# Patient Record
Sex: Female | Born: 1970 | Race: White | Hispanic: No
Health system: Southern US, Community
[De-identification: ages and names within clinical notes are randomized; demographics above are authoritative.]

## PROBLEM LIST (undated history)

## (undated) DIAGNOSIS — J45909 Unspecified asthma, uncomplicated: Secondary | ICD-10-CM

## (undated) DIAGNOSIS — J40 Bronchitis, not specified as acute or chronic: Secondary | ICD-10-CM

## (undated) HISTORY — DX: Unspecified asthma, uncomplicated: J45.909

## (undated) HISTORY — DX: Bronchitis, not specified as acute or chronic: J40

## (undated) HISTORY — PX: ECTOPIC PREGNANCY SURGERY: SHX613

## (undated) HISTORY — PX: CHOLECYSTECTOMY: SHX55

## (undated) HISTORY — PX: OVARIAN CYST REMOVAL: SHX89

---

## 2001-09-20 ENCOUNTER — Emergency Department (HOSPITAL_COMMUNITY): Admission: EM | Admit: 2001-09-20 | Discharge: 2001-09-20 | Payer: Self-pay | Admitting: Emergency Medicine

## 2009-04-15 ENCOUNTER — Emergency Department: Payer: Self-pay | Admitting: Emergency Medicine

## 2010-11-19 ENCOUNTER — Emergency Department: Payer: Self-pay | Admitting: Emergency Medicine

## 2011-10-28 ENCOUNTER — Ambulatory Visit: Payer: Self-pay | Admitting: Surgery

## 2011-10-28 LAB — PREGNANCY, URINE: Pregnancy Test, Urine: NEGATIVE m[IU]/mL

## 2014-07-26 NOTE — Op Note (Signed)
PATIENT NAME:  Kaylee Burke, Kaylee Burke MR#:  [id number] DATE OF BIRTH:  02-Feb-1971  DATE OF PROCEDURE:  10/28/2011  PREOPERATIVE DIAGNOSIS: Chronic cholecystitis, cholelithiasis.   POSTOPERATIVE DIAGNOSIS: Chronic cholecystitis, cholelithiasis.   PROCEDURE: Laparoscopic cholecystectomy.   SURGEON: Adella HareJ. Wilton Smith, MD   ANESTHESIA: General.   INDICATIONS: This 44 year old female has a history of right upper quadrant abdominal pain with ultrasound findings of gallstones. Surgery was recommended for definitive treatment.   DESCRIPTION OF PROCEDURE: The patient was placed on the operating table in the supine position under general endotracheal anesthesia. The abdomen was prepared with ChloraPrep and draped in a sterile manner.   A short incision was made in the inferior aspect of the umbilicus and carried down to the deep fascia which was grasped with laryngeal hook and elevated. A Veress needle was inserted, aspirated, and irrigated with a saline solution. Next, the peritoneal cavity was inflated with carbon dioxide. The Veress needle was removed. The 10 mm cannula was inserted. The 10 mm 0 degree laparoscope was inserted to view the peritoneal cavity. The liver appeared normal. The stomach appeared normal. Omentum was observed. Next, another incision was made in the epigastrium slightly to the right of the midline to introduce an 11 mm cannula. Two incisions were made in the lateral aspect of the right upper quadrant to introduce two 5-mm cannulas.   With the patient in the reverse Trendelenburg position, turned some 5 degrees to the left, the gallbladder was retracted towards the right shoulder. It did appear to have a slightly thickened wall. The pouch of Randa SpikeMorison was then retracted inferiorly and laterally. The porta hepatis was demonstrated. The neck of the gallbladder was mobilized with incision of the visceral peritoneum. The cystic duct was dissected free from surrounding structures. The cystic  artery was dissected free from surrounding structures. A critical view of safety was demonstrated. An Endoclip was placed across the cystic duct adjacent to the neck of the gallbladder. An incision was made in the cystic duct to introduce a Reddick catheter. The catheter would only thread in about 6 mm and would not stay in the duct when the balloon was inflated. It appeared that the cystic duct was small in size and, therefore, cholangiogram was not done. The Reddick catheter was removed. The cystic duct was doubly ligated with endoclips and divided. The cystic artery was controlled with an Endoclip and divided. Another branch of the cystic artery was controlled with Endoclip and divided. The gallbladder was dissected free from the liver with hook and cautery. The gallbladder was completely separated. Hemostasis was intact. The gallbladder was delivered up through the infraumbilical incision, opened, and suctioned. Multiple stones were removed with the stone forceps and the stone scoop. The gallbladder was then removed and submitted in formalin with stones for routine pathology. The right upper quadrant was further inspected, irrigated, and aspirated. Hemostasis was intact. The cannulas were removed allowing carbon dioxide to escape from the peritoneal cavity. Skin incisions were closed with interrupted 5-0 chromic subcuticular sutures, benzoin, and Steri-Strips. Dressings were applied with paper tape. The patient tolerated surgery satisfactorily and was then prepared for transfer to the recovery room for postoperative care.   ____________________________ J. Renda RollsWilton Smith, MD jws:drc D: 10/28/2011 13:24:00 ET T: 10/28/2011 13:43:39 ET JOB#: [id number]  cc: Adella HareJ. Wilton Smith, MD, <Dictator> Adella HareWILTON J SMITH MD ELECTRONICALLY SIGNED 10/28/2011 19:45

## 2015-09-26 ENCOUNTER — Other Ambulatory Visit: Payer: Self-pay | Admitting: Gastroenterology

## 2015-09-26 DIAGNOSIS — R1310 Dysphagia, unspecified: Secondary | ICD-10-CM

## 2015-09-28 ENCOUNTER — Ambulatory Visit: Payer: Self-pay

## 2015-10-04 ENCOUNTER — Ambulatory Visit
Admission: RE | Admit: 2015-10-04 | Discharge: 2015-10-04 | Disposition: A | Discharge status: Home / Self Care | Payer: BC Managed Care – PPO | Source: Ambulatory Visit | Attending: Gastroenterology | Admitting: Gastroenterology

## 2015-10-04 DIAGNOSIS — R1013 Epigastric pain: Secondary | ICD-10-CM | POA: Insufficient documentation

## 2015-10-04 DIAGNOSIS — K219 Gastro-esophageal reflux disease without esophagitis: Secondary | ICD-10-CM | POA: Diagnosis not present

## 2015-10-04 DIAGNOSIS — R1310 Dysphagia, unspecified: Secondary | ICD-10-CM

## 2015-10-25 ENCOUNTER — Encounter: Payer: BC Managed Care – PPO | Attending: Family Medicine | Admitting: Dietician

## 2015-10-25 ENCOUNTER — Encounter: Payer: Self-pay | Admitting: Dietician

## 2015-10-25 VITALS — Ht 62.0 in | Wt 171.4 lb

## 2015-10-25 DIAGNOSIS — E669 Obesity, unspecified: Secondary | ICD-10-CM

## 2015-10-25 DIAGNOSIS — E785 Hyperlipidemia, unspecified: Secondary | ICD-10-CM | POA: Diagnosis not present

## 2015-10-25 DIAGNOSIS — E119 Type 2 diabetes mellitus without complications: Secondary | ICD-10-CM

## 2015-10-25 NOTE — Patient Instructions (Signed)
   Increase calorie intake earlier in the day.   Limit late-night snacks to 1 small snack, maybe 15-25 grams carb and 7-15grams protein.   Continue making healthy food choices, great job!  Plan ahead to allow for balanced meals, and make sure to include a protein source with each meal. Protein goal is 65grams daily.

## 2015-10-25 NOTE — Progress Notes (Signed)
Medical Nutrition Therapy: Visit start time: 1630  end time: 1730  Assessment:  Diagnosis: overweight Past medical history: hyperlipidemia since her 20's, Diabetes, GERD Psychosocial issues/ stress concerns: patient reports high stress level Preferred learning method:  . Auditory . Visual . Hands-on  Current weight: 171.4lbs  Height: 5'2" Medications, supplements: reviewed list in chart with patient  Progress and evaluation: Patient reports about 30lb weight gain in past several years.          Tried Nutrisystem for a short time, but foods provided didn't suit her tastes and lifestyle.          Has followed vegetarian diet (lacto-ovo) but has added some meats recently -- was weak, tired without meats.           She has had high cholesterol levels for over 20 years, strong family history of high cholesterol. She is not taking cholesterol-lowering meds due to side effects.   Physical activity: walking, swimming weights 30 minutes, 3 times a week; has been sporadic   Dietary Intake:  Usual eating pattern includes 2-3 meals and 2 snacks per day. Dining out frequency: 5 meals per week.  Breakfast: maybe yogurt at 8-8:30 when arriving at work Snack: thin bites nuts, or mixed nuts, string cheese Lunch: 12:30 butternut squash ravioli, sandwich with tofu, sometimes just vegetables, chicken and green beans once a week.  Snack: none Supper: sometimes light meal like cheese and fruit, pizza once a week, tofu and vegetables. Does eat beans often.  Snack: cheese and fruit, veggie chips Beverages: water (trying to increase, sometimes difficult due to work), sugar free beverages: diet mt dew, crystal light  Nutrition Care Education: Topics covered: weight management, Diabetes, Hyperlipidemia Basic nutrition: basic food groups, appropriate nutrient balance, appropriate meal and snack schedule, general nutrition guidelines    Weight control: benefits of weight control, behavioral changes for weight  loss: guidance for 1300kcal daily intake -- with gradual "refeeding" to improve nutrient intake. Benefits of tracking food intake. Diabetes: appropriate meal and snack schedule, appropriate carb intake and balance Hyperlipidemia: healthy and unhealthy fats, role of fiber; food sources of folate, phytochemicals Other:  benefits of regular exercise; daily protein needs and importance of adequate B12, zinc, iron.   Nutritional Diagnosis:  [place]-3.3 Overweight/obesity As related to history of inactivity, excess calories.  As evidenced by patient report.  Intervention: Instruction as noted above.   Patient seems to be under-eating; was averaging 800kcal when tracking intake; advised adding about 100kcal every few weeks to prevent weight gain.    Set goals with patient input.   Education Materials given:  . General diet guidelines for Cholesterol-lowering/ Heart health . Food lists/ Planning A Balanced Meal . Sample meal pattern/ menus: Quick and Healthy Meal Ideas . Goals/ instructions   Learner/ who was taught:  . Patient   Level of understanding: Marland Kitchen. Verbalizes/ demonstrates competency  Demonstrated degree of understanding via:   Teach back Learning barriers: . None  Willingness to learn/ readiness for change: . Eager, change in progress  Monitoring and Evaluation:  Dietary intake, exercise, blood sugar and blood lipid levels, and body weight      follow up: 12/06/15

## 2015-12-06 ENCOUNTER — Ambulatory Visit: Payer: BC Managed Care – PPO | Admitting: Dietician

## 2015-12-25 ENCOUNTER — Encounter: Admission: RE | Payer: Self-pay | Source: Ambulatory Visit

## 2015-12-25 ENCOUNTER — Ambulatory Visit
Admission: RE | Admit: 2015-12-25 | Payer: BC Managed Care – PPO | Source: Ambulatory Visit | Admitting: Gastroenterology

## 2015-12-25 SURGERY — ESOPHAGOGASTRODUODENOSCOPY (EGD) WITH PROPOFOL
Anesthesia: General

## 2016-01-04 ENCOUNTER — Encounter: Payer: Self-pay | Admitting: Dietician

## 2016-01-04 ENCOUNTER — Encounter: Payer: BC Managed Care – PPO | Attending: Family Medicine | Admitting: Dietician

## 2016-01-04 VITALS — Ht 62.0 in | Wt 175.9 lb

## 2016-01-04 DIAGNOSIS — E785 Hyperlipidemia, unspecified: Secondary | ICD-10-CM | POA: Diagnosis not present

## 2016-01-04 DIAGNOSIS — E669 Obesity, unspecified: Secondary | ICD-10-CM

## 2016-01-04 NOTE — Patient Instructions (Signed)
   Resume tracking food intake and aim for an average of 1200 calories daily.   Work to increase exercise frequency, duration, and/ or intensity. Continue to find ways to move throughout the day.

## 2016-01-04 NOTE — Progress Notes (Signed)
Medical Nutrition Therapy: Visit start time: 1620  end time: 1650  Assessment:  Diagnosis: overweight, hyperlipidemia Medical history changes: no changes Psychosocial issues/ stress concerns: none  Current weight: 175.9lbs  Height: 5'2" Medications, supplement changes: no changes  Progress and evaluation: Patient reports increasing her intake at breakfast, and is eating breakfast daily. She is eating more for morning snack and at lunch; but has stopped evening snacks. She has also stopped all sodas, including diet. She has been using steroid based inhaler recently and wonders if it could affect weight.    Physical activity: walking 30 minutes, 3 times a week, yoga in evenings  Dietary Intake:  Usual eating pattern includes 3 meals and 2 snacks per day. Dining out frequency: not assessed today.  Breakfast: yogurt, or eggs, some vegetarian proteins Snack: nuts, cheese, or hummus with veggie straws or pretzels.  Lunch: vegetarian taco, sometimes vegetables only- broc, carrots, cauli, soybeans, water chestnuts; sandwich with veggie protein, cheese, lettuce, tomato Snack: same as am Supper: vegetables or rice with tofu, occasionally pizza,  Snack: none Beverages: water, some with crystal light flavoring  Nutrition Care Education: Topics covered: weight management Weight control: reviewed kcal needs for weight loss; reviewed patient's current eating pattern; discussed benefits of tracking food intake and importance of exercise in weight management.   Nutritional Diagnosis:  [place]-3.3 Overweight/obesity As related to history of excess calories, limited activity.  As evidenced by patient report.  Intervention: Discussion as noted above.   Commended patient for changes made.    She seems to be taking in adequate protein, and reports labwork is normal except for low vitamin D.    Updated goals with patient input.   Education Materials given:  Marland Kitchen. Goals/ instructions   Learner/ who was taught:   . Patient   Level of understanding: Marland Kitchen. Verbalizes/ demonstrates competency  Demonstrated degree of understanding via:   Teach back Learning barriers: . None  Willingness to learn/ readiness for change: . Eager, change in progress  Monitoring and Evaluation:  Dietary intake, exercise, cholesterol, and body weight      follow up: 02/22/16

## 2016-01-16 ENCOUNTER — Ambulatory Visit (INDEPENDENT_AMBULATORY_CARE_PROVIDER_SITE_OTHER): Payer: BC Managed Care – PPO | Admitting: Pulmonary Disease

## 2016-01-16 ENCOUNTER — Encounter: Payer: Self-pay | Admitting: Pulmonary Disease

## 2016-01-16 VITALS — BP 126/72 | HR 99 | Ht 62.0 in | Wt 174.0 lb

## 2016-01-16 DIAGNOSIS — J4530 Mild persistent asthma, uncomplicated: Secondary | ICD-10-CM

## 2016-01-16 DIAGNOSIS — R0602 Shortness of breath: Secondary | ICD-10-CM

## 2016-01-16 NOTE — Patient Instructions (Addendum)
Symbicort (which is an inhaled corticosteroid plus a long-acting bronchodilator) is an excellent choice for maintenance of your asthma. It needs to be used twice a day every day   Follow up in 3 months and if your asthma control remains excellent, we will consider stepping down to a simple inhaled corticosteroid

## 2016-01-17 NOTE — Progress Notes (Signed)
PULMONARY CONSULT NOTE  Requesting MD/Service: Jerl Mina, MD Date of initial consultation: 01/16/16 Reason for consultation: DOE, asthma  PT PROFILE: 42 F never smoker with frequent "bronchitis" as child as one month of dyspnea, chest tightness, cough markedly improved after initiation of Symbicort inhaler. Initial impression/plan: Asthma, well controlled on Symbicort. Cont same. Follow up in 2 months with PFTs and consider step down to ICS if remains very well controlled  HPI:  59 F never smoker referred for evaluation of one month of DOE, chest tightness/heaviness, throat clearing cough. She has been trying to increase her activity level and notes these symptoms when walking at an above average pace. She has had no CP, fever, purulent sputum, hemoptysis, LE edema and calf tenderness. She does reports "frequent bronchitis" as a child. She has recently been started on an albuterol inhaler by Dr Burnett Sheng which relieves her symptoms and she is now on Symbicort which has made a significant difference to the point that she has only minimal symptoms and has not used albuterol since starting.  \  Past Medical History:  Diagnosis Date  . Asthma   . Bronchitis     Past Surgical History:  Procedure Laterality Date  . CHOLECYSTECTOMY    . ECTOPIC PREGNANCY SURGERY    . OVARIAN CYST REMOVAL      MEDICATIONS: I have reviewed all medications and confirmed regimen as documented  Social History   Social History  . Marital status: Married    Spouse name: N/A  . Number of children: N/A  . Years of education: N/A   Occupational History  . Not on file.   Social History Main Topics  . Smoking status: Passive Smoke Exposure - Never Smoker  . Smokeless tobacco: Never Used  . Alcohol use 0.0 oz/week     Comment: occasional, not regularly  . Drug use: No  . Sexual activity: Not on file   Other Topics Concern  . Not on file   Social History Narrative  . No narrative on file    Family  History  Problem Relation Age of Onset  . COPD Mother   . Diabetes Father   . Cancer - Prostate Father   . Asthma Sister   . Heart disease Sister   . Heart disease Maternal Grandfather   . Hypertension Maternal Grandfather   . Deep vein thrombosis Maternal Grandfather   . Diabetes Maternal Grandfather   . COPD Paternal Grandmother   . Liver cancer Paternal Grandfather   . Cancer - Prostate Paternal Grandfather   . Kidney cancer Paternal Grandfather   . Diabetes Paternal Grandfather     ROS: No fever, myalgias/arthralgias, unexplained weight loss or weight gain No new focal weakness or sensory deficits No otalgia, hearing loss, visual changes, nasal and sinus symptoms, mouth and throat problems No neck pain or adenopathy No abdominal pain, N/V/D, diarrhea, change in bowel pattern No dysuria, change in urinary pattern   Vitals:   01/16/16 1054  BP: 126/72  Pulse: 99  SpO2: 97%  Weight: 174 lb (78.9 kg)  Height: 5\' 2"  (1.575 m)     EXAM:  Gen: WDWN, No overt respiratory distress HEENT: NCAT, sclera white, oropharynx normal Neck: Supple without LAN, thyromegaly, JVD Lungs: breath sounds: full, percussion: normal, No wheezes Cardiovascular: RRR, no murmurs noted Abdomen: Soft, nontender, normal BS Ext: without clubbing, cyanosis, edema Neuro: CNs grossly intact, motor and sensory intact Skin: Limited exam, no lesions noted  DATA:   No flowsheet data found.  No flowsheet data found.  CXR:  None available  IMPRESSION:   Asthma - likely mild to moderate persistent. Her history of recurrent childhood "bronchitis" was probably asthma or a antecedent to asthma. She has responded very well to Symbicort which virtually confirms the diagnosis. We were unable to perform office spirometry due to equipment malfunction. This will need to be performed on follow up   PLAN:  1) Continue Symbicort and PRN albuterol 2) Follow up in 2 months with office spirometry 3) If her  control remains excellent, we will consider stepping down to an ICS   Billy Fischeravid Dione Mccombie, MD PCCM service Mobile [phone] Pager [phone] 01/17/2016

## 2016-02-22 ENCOUNTER — Ambulatory Visit: Payer: BC Managed Care – PPO | Admitting: Dietician

## 2016-03-11 ENCOUNTER — Encounter: Payer: Self-pay | Admitting: Dietician

## 2016-03-11 NOTE — Progress Notes (Signed)
Have not heard back from patient to reschedule appointment for 02/22/16, which she cancelled. Sent discharge letter to MD.

## 2017-03-17 ENCOUNTER — Ambulatory Visit: Payer: BC Managed Care – PPO | Admitting: Pulmonary Disease

## 2017-03-21 ENCOUNTER — Telehealth: Payer: Self-pay | Admitting: Pulmonary Disease

## 2017-03-21 NOTE — Telephone Encounter (Signed)
Patient request [facility] records.  Printed 2017 ov notes placed at front desk with ROI to be signed .  She will pickup and sign ROI at next ov on 04/09/17

## 2017-04-09 ENCOUNTER — Encounter: Payer: Self-pay | Admitting: Pulmonary Disease

## 2017-04-09 ENCOUNTER — Ambulatory Visit (INDEPENDENT_AMBULATORY_CARE_PROVIDER_SITE_OTHER): Payer: BC Managed Care – PPO | Admitting: Pulmonary Disease

## 2017-04-09 VITALS — BP 110/80 | HR 100 | Ht 62.0 in | Wt 160.0 lb

## 2017-04-09 DIAGNOSIS — J4530 Mild persistent asthma, uncomplicated: Secondary | ICD-10-CM | POA: Diagnosis not present

## 2017-04-09 MED ORDER — ALBUTEROL SULFATE HFA 108 (90 BASE) MCG/ACT IN AERS: 1.0000 | INHALATION_SPRAY | RESPIRATORY_TRACT | 10 refills | Status: AC | PRN

## 2017-04-09 MED ORDER — BUDESONIDE-FORMOTEROL FUMARATE 160-4.5 MCG/ACT IN AERO: 2.0000 | INHALATION_SPRAY | Freq: Two times a day (BID) | RESPIRATORY_TRACT | 10 refills | Status: AC

## 2017-04-09 NOTE — Progress Notes (Signed)
PULMONARY OFFICE FOLLOW UP NOTE  Requesting MD/Service: Kaylee MinaJames Hedrick, MD Date of initial consultation: 01/16/16 Reason for consultation: DOE, asthma  PT PROFILE: [age] F never smoker with frequent "bronchitis" as child initially evaluated with history of one month of dyspnea, chest tightness, cough markedly improved after initiation of Symbicort inhaler. Initial impression/plan: Likely asthma, well controlled on Symbicort. Cont same. Follow up in 2 months with PFTs and consider step down to ICS if remains very well controlled  DATA:   INTERVAL:    SUBJ:  Last seen 01/16/16. Missed follow up appointment. Returns today for routine re-evaluation. Still reports mild persistent and variable DOE. Also mild NP cough. Denies CP, fever, purulent sputum, hemoptysis, LE edema and calf tenderness.   Vitals:   04/09/17 0824 04/09/17 0834  BP:  110/80  Pulse:  100  SpO2:  98%  Weight: 72.6 kg (160 lb)   Height: 5\' 2"  (1.575 m)      EXAM:  Gen: NAD HEENT: NCAT, sclera white Neck: No JVD Lungs: breath sounds full, no wheezes or other adventitious sounds Cardiovascular: RRR, no murmurs Abdomen: Soft, nontender, normal BS Ext: without clubbing, cyanosis, edema Neuro: grossly intact Skin: Limited exam, no lesions noted  DATA:   No flowsheet data found.  No flowsheet data found.  CXR:  None available  IMPRESSION:   Probable asthma - likely mild to moderate persistent.   PLAN:  1) Continue Symbicort and PRN albuterol 2) Follow up in 3 months with PFTs prior   Billy Fischeravid Merl Bommarito, MD PCCM service Mobile [phone] Pager [phone] 04/09/2017 8:53 AM

## 2017-04-09 NOTE — Patient Instructions (Signed)
Continue Symbicort inhaler - 2 inhalations twice a day every day. Rinse mouth after use Cotninue albuterol rescue inhaler as needed for shortness of breath , cough, chest tightness, wheezing Follow up in 3 months with lung function tests prior to that visit

## 2017-04-18 ENCOUNTER — Telehealth: Payer: Self-pay | Admitting: Pulmonary Disease

## 2017-04-18 DIAGNOSIS — J4530 Mild persistent asthma, uncomplicated: Secondary | ICD-10-CM

## 2017-04-18 NOTE — Telephone Encounter (Signed)
Pt aware that PCC's will contact her regarding scheduling PFT.  Pt voiced her understanding and had no further questions.  Kaylee Burke please advise. Thanks.

## 2017-04-18 NOTE — Telephone Encounter (Signed)
Patient calling to schedule PFT

## 2017-04-18 NOTE — Telephone Encounter (Signed)
I called the patient to try and schedule her PFT but I had to lvm for her to call me back. She is not due to do the PFT for 3 months right before 3 month rov per Dr. Sung AmabileSimonds

## 2017-08-07 ENCOUNTER — Ambulatory Visit: Payer: BC Managed Care – PPO

## 2017-08-14 ENCOUNTER — Ambulatory Visit: Payer: BC Managed Care – PPO | Admitting: Pulmonary Disease

## 2017-08-21 ENCOUNTER — Ambulatory Visit: Payer: BC Managed Care – PPO | Attending: Pulmonary Disease

## 2017-08-21 DIAGNOSIS — J4530 Mild persistent asthma, uncomplicated: Secondary | ICD-10-CM

## 2017-08-21 MED ORDER — ALBUTEROL SULFATE (2.5 MG/3ML) 0.083% IN NEBU
2.5000 mg | INHALATION_SOLUTION | Freq: Once | RESPIRATORY_TRACT | Status: AC
  Administered 2017-08-21: 2.5 mg via RESPIRATORY_TRACT
  Filled 2017-08-21: qty 3

## 2017-09-02 ENCOUNTER — Other Ambulatory Visit
Admission: RE | Admit: 2017-09-02 | Discharge: 2017-09-02 | Disposition: A | Discharge status: Home / Self Care | Payer: BC Managed Care – PPO | Source: Ambulatory Visit | Attending: Pulmonary Disease | Admitting: Pulmonary Disease

## 2017-09-02 ENCOUNTER — Ambulatory Visit (INDEPENDENT_AMBULATORY_CARE_PROVIDER_SITE_OTHER): Payer: BC Managed Care – PPO | Admitting: Pulmonary Disease

## 2017-09-02 ENCOUNTER — Encounter: Payer: Self-pay | Admitting: Pulmonary Disease

## 2017-09-02 VITALS — BP 120/78 | HR 87 | Ht 62.0 in | Wt 158.0 lb

## 2017-09-02 DIAGNOSIS — J4540 Moderate persistent asthma, uncomplicated: Secondary | ICD-10-CM

## 2017-09-02 LAB — CBC WITH DIFFERENTIAL/PLATELET
BASOS PCT: 0 %
Basophils Absolute: 0 10*3/uL (ref 0–0.1)
EOS PCT: 1 %
Eosinophils Absolute: 0.1 10*3/uL (ref 0–0.7)
HCT: 42 % (ref 35.0–47.0)
HEMOGLOBIN: 14.5 g/dL (ref 12.0–16.0)
Lymphocytes Relative: 26 %
Lymphs Abs: 2.3 10*3/uL (ref 1.0–3.6)
MCH: 31.3 pg (ref 26.0–34.0)
MCHC: 34.5 g/dL (ref 32.0–36.0)
MCV: 90.9 fL (ref 80.0–100.0)
MONOS PCT: 6 %
Monocytes Absolute: 0.6 10*3/uL (ref 0.2–0.9)
NEUTROS PCT: 67 %
Neutro Abs: 5.9 10*3/uL (ref 1.4–6.5)
PLATELETS: 266 10*3/uL (ref 150–440)
RBC: 4.63 MIL/uL (ref 3.80–5.20)
RDW: 13.2 % (ref 11.5–14.5)
WBC: 8.9 10*3/uL (ref 3.6–11.0)

## 2017-09-02 MED ORDER — MONTELUKAST SODIUM 10 MG PO TABS: 10.0000 mg | ORAL_TABLET | Freq: Every day | ORAL | 10 refills | Status: DC

## 2017-09-02 MED ORDER — MONTELUKAST SODIUM 10 MG PO TABS: 10.0000 mg | ORAL_TABLET | Freq: Every day | ORAL | 10 refills | Status: AC

## 2017-09-02 NOTE — Patient Instructions (Addendum)
Blood test today: CBC with differential, IgE level, RAST test New medication: Montelukast (Singulair) 10 mg daily @ bedtime  You may try on and off medication to determine its benefit as discussed Continue Symbicort 160-4.5, 2 inhalations twice daily.  Rinse mouth after use Continue albuterol inhaler as needed for increased shortness of breath, wheezing, chest tightness, cough  May also use albuterol prior to vigorous exercise or exertion  Follow-up in 2 to 3 months

## 2017-09-02 NOTE — Progress Notes (Signed)
PULMONARY OFFICE FOLLOW UP NOTE  Requesting MD/Service: Jerl Mina, MD Date of initial consultation: 01/16/16 Reason for consultation: DOE, asthma  PT PROFILE: 89 F never smoker with frequent "bronchitis" as child initially evaluated with history of one month of dyspnea, chest tightness, cough markedly improved after initiation of Symbicort inhaler. Initial impression/plan: Likely asthma, well controlled on Symbicort. Cont same. Follow up in 2 months with PFTs and consider step down to ICS if remains very well controlled  DATA: 08/21/17 PFTs: FVC: 2.86 > 3.11 L (95 > 104 %pred), FEV1: 1.78 > 2.62 L (71 > 104 %pred), FEV1/FVC: 62% >84%, TLC: 4.76 L (103 %pred), DLCO 99 %pred    INTERVAL: No major events  SUBJ:  This is a scheduled ROV for asthma.  She remains on Symbicort.  She continues to have moderate exertional dyspnea and chest tightness with some day-to-day variation.  She also reports intermittent nonproductive cough.  Her symptoms are particularly bad during this heavy pollen season. Denies CP, fever, purulent sputum, hemoptysis, LE edema and calf tenderness.   Vitals:   09/02/17 0851 09/02/17 0852  BP:  120/78  Pulse:  87  SpO2:  99%  Weight: 158 lb (71.7 kg)   Height:  (1.575 m)      EXAM:  Gen: NAD HEENT: NCAT, sclera white Neck: No JVD Lungs: breath sounds full, no wheezes or other adventitious sounds Cardiovascular: RRR, no murmurs Abdomen: Soft, nontender, normal BS Ext: without clubbing, cyanosis, edema Neuro: grossly intact Skin: Limited exam, no lesions noted   DATA:   No flowsheet data found.  CBC Latest Ref Rng & Units 09/02/2017  WBC 3.6 - 11.0 K/uL 8.9  Hemoglobin 12.0 - 16.0 g/dL 16.1  Hematocrit 09.6 - 47.0 % 42.0  Platelets 150 - 440 K/uL 266    CXR:  None available  IMPRESSION:   Moderate persistent asthma without acute complication - the degree of improvement after bronchodilator therapy on PFT testing is impressive.  He assures  me that she has been compliant with her controller medication (Symbicort) but did not take it on the morning of testing.  Overall, her symptoms are inadequately controlled.   PLAN:  Blood test today: CBC with differential, IgE level, RAST test New medication: Montelukast (Singulair) 10 mg daily @ bedtime  She may try on and off medication to determine its benefit  Continue Symbicort 160-4.5, 2 inhalations twice daily.  Rinse mouth after use Continue albuterol inhaler as needed for increased shortness of breath, wheezing, chest tightness, cough  May also use albuterol prior to vigorous exercise or exertion  Follow-up in 2 to 3 months  Billy Fischer, MD PCCM service Mobile [phone] Pager [phone] 09/02/2017 10:38 AM

## 2017-09-04 LAB — IGE: IgE (Immunoglobulin E), Serum: 15 IU/mL (ref 6–495)

## 2017-11-19 ENCOUNTER — Telehealth: Payer: Self-pay | Admitting: Pulmonary Disease

## 2017-11-19 NOTE — Telephone Encounter (Signed)
Please advise on message below.

## 2017-11-19 NOTE — Telephone Encounter (Signed)
New Message  Pt verbalized she is needing a letter stating Dr. Westley HummerSimond's treats her with asthma.  Please f/u with pt

## 2017-11-20 ENCOUNTER — Encounter: Payer: Self-pay | Admitting: *Deleted

## 2017-11-20 NOTE — Telephone Encounter (Signed)
Pt informed letter is ready for pick up. Nothing further needed.

## 2017-11-20 NOTE — Telephone Encounter (Signed)
Please copy and paste the following onto our letterhead and I will sign. Then I guess she will pick it up. The message is kind of vague:  To whom it may concern:  Ms Claiborne Riggatricia Mccaskey has asked that I submit this declaration that she is under my care for moderate persistent asthma which requires multiple medications to control.    Sincerely,     Billy Fischeravid Simonds, MD [facility] [address] [place], [place] [address] [phone]

## 2017-12-05 ENCOUNTER — Ambulatory Visit: Payer: BC Managed Care – PPO | Admitting: Pulmonary Disease

## 2018-01-26 ENCOUNTER — Ambulatory Visit: Payer: BC Managed Care – PPO | Admitting: Pulmonary Disease

## 2018-01-27 ENCOUNTER — Encounter: Payer: Self-pay | Admitting: Pulmonary Disease

## 2020-03-10 ENCOUNTER — Other Ambulatory Visit: Payer: Self-pay | Admitting: Gastroenterology

## 2020-03-10 DIAGNOSIS — R1310 Dysphagia, unspecified: Secondary | ICD-10-CM | POA: Insufficient documentation

## 2020-03-10 DIAGNOSIS — R1013 Epigastric pain: Secondary | ICD-10-CM

## 2020-03-10 DIAGNOSIS — K219 Gastro-esophageal reflux disease without esophagitis: Secondary | ICD-10-CM | POA: Insufficient documentation

## 2020-05-19 ENCOUNTER — Encounter
Admission: RE | Admit: 2020-05-19 | Discharge: 2020-05-19 | Disposition: A | Discharge status: Home / Self Care | Payer: BC Managed Care – PPO | Source: Ambulatory Visit | Attending: Gastroenterology | Admitting: Gastroenterology

## 2020-05-19 ENCOUNTER — Other Ambulatory Visit: Payer: Self-pay

## 2020-05-19 DIAGNOSIS — R1013 Epigastric pain: Secondary | ICD-10-CM | POA: Insufficient documentation

## 2020-05-19 MED ORDER — TECHNETIUM TC 99M SULFUR COLLOID
2.0000 | Freq: Once | INTRAVENOUS | Status: AC | PRN
  Administered 2020-05-19: 2.14 via INTRAVENOUS

## 2020-09-20 ENCOUNTER — Ambulatory Visit: Payer: BC Managed Care – PPO | Admitting: Dermatology

## 2021-06-11 ENCOUNTER — Ambulatory Visit: Payer: BC Managed Care – PPO | Admitting: Surgery

## 2021-06-13 ENCOUNTER — Encounter: Payer: Self-pay | Admitting: Surgery

## 2021-06-13 ENCOUNTER — Other Ambulatory Visit: Payer: Self-pay

## 2021-06-13 ENCOUNTER — Telehealth: Payer: Self-pay

## 2021-06-13 ENCOUNTER — Ambulatory Visit: Payer: BC Managed Care – PPO | Admitting: Surgery

## 2021-06-13 VITALS — BP 137/90 | HR 92 | Temp 98.0°F | Ht 62.0 in | Wt 179.4 lb

## 2021-06-13 DIAGNOSIS — K449 Diaphragmatic hernia without obstruction or gangrene: Secondary | ICD-10-CM

## 2021-06-13 DIAGNOSIS — F520 Hypoactive sexual desire disorder: Secondary | ICD-10-CM | POA: Insufficient documentation

## 2021-06-13 DIAGNOSIS — K219 Gastro-esophageal reflux disease without esophagitis: Secondary | ICD-10-CM

## 2021-06-13 NOTE — Patient Instructions (Addendum)
CT Abdomen /Pelvis [facility] [place] [facility] on 06/28/21 @ 12:45 pm. Nothing to eat/drink 4 hours prior. Please pick up prep kit today at the Radiology department. ? ?Barium swallow scheduled at [facility] on 07/05/21 @ 9:45 am. Nothing to eat/drink 3 hours prior. ? ?Referral sent to [facility]. Someone from their office will call to schedule an appointment. Please call their office if you do not hear from anyone in 2 days. [phone]. ? ?Please see your follow up appointment listed below.  ?

## 2021-06-13 NOTE — Telephone Encounter (Signed)
Scheduled for 09/17/2021 ?

## 2021-06-16 NOTE — Progress Notes (Signed)
Patient ID: Kaylee Burke, female   DOB: 12-16-1970, 51 y.o.   MRN: [id number] ? ?HPI ?Kaylee Burke is a 51 y.o. female seen in consultation at the request of Dr. Karna Christmas for GERD.  She has been having GERD reflux for several years now.  She does have asthma and chronic cough.  Pulmonary has seen her and recent CT of the chest showed GGO infiltrates.  She reports that her pulmonary symptoms seems to be exacerbated by reflux.  She does have heartburn that seems to be worsening when she lies down.  She is on maximum PPI therapy.  She cannot skip more than a couple of days and the symptoms are back.  She is very interested in antireflux procedures.  She already had barium swallow that I personally reviewed from a few years ago normal swallow with reflux.  Her last EGD was a couple of years ago at outside facility.  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 ?She has had significant issues with dysphagia.  She did have esophageal motility study that was normal.  This was about a year ago. ?She works at the [employer] at [employer].  She is very active and is able to perform more than 4 METS of activity without any shortness of or chest pain.  She has been seen by [facility] before but not completely satisfied with care.  She did have a CT a of the chest close to 2 years ago with some pulmonary infiltrates.  I do not have the images. ?She does have a normal CBC and CMP shows very mild elevation of the ALT up to 50. ?She comes accompanied by her husband ? ? ?HPI ? ?Past Medical History:  ?Diagnosis Date  ? Asthma   ? Bronchitis   ? ? ?Past Surgical History:  ?Procedure Laterality Date  ? CHOLECYSTECTOMY    ? ECTOPIC PREGNANCY SURGERY    ? OVARIAN CYST REMOVAL    ? ? ?Family History  ?Problem Relation Age of Onset  ? COPD Mother   ? Diabetes Father   ? Cancer - Prostate Father   ? Asthma Sister   ? Heart disease Sister   ? Heart disease Maternal Grandfather   ? Hypertension Maternal  Grandfather   ? Deep vein thrombosis Maternal Grandfather   ? Diabetes Maternal Grandfather   ? COPD Paternal Grandmother   ? Liver cancer Paternal Grandfather   ? Cancer - Prostate Paternal Grandfather   ? Kidney cancer Paternal Grandfather   ? Diabetes Paternal Grandfather   ? ? ?Social History ?Social History  ? ?Tobacco Use  ? Smoking status: Passive Smoke Exposure - Never Smoker  ? Smokeless tobacco: Never  ?Vaping Use  ? Vaping Use: Never used  ?Substance Use Topics  ? Alcohol use: Yes  ?  Alcohol/week: 0.0 standard drinks  ?  Comment: occasional, not regularly  ? Drug use: No  ? ? ?Allergies  ?Allergen Reactions  ? Clarithromycin Nausea And Vomiting  ? Duloxetine Nausea And Vomiting  ?  Other reaction(s): Other (See Comments), Vomiting  ? Penicillins Rash  ? ? ?Current Outpatient Medications  ?Medication Sig Dispense Refill  ? albuterol (PROAIR HFA) 108 (90 Base) MCG/ACT inhaler Inhale 1-2 puffs into the lungs every 4 (four) hours as needed for wheezing or shortness of breath. 1 Inhaler 10  ? budesonide-formoterol (SYMBICORT) 160-4.5 MCG/ACT inhaler Inhale 2 puffs into the lungs 2 (two) times daily. 1 Inhaler 10  ? esomeprazole (NEXIUM) 40 MG  capsule Take by mouth.    ? montelukast (SINGULAIR) 10 MG tablet Take 1 tablet (10 mg total) by mouth at bedtime. 30 tablet 10  ? DULoxetine (CYMBALTA) 30 MG capsule Take by mouth.    ? Fluticasone-Umeclidin-Vilant (TRELEGY ELLIPTA) 100-62.5-25 MCG/ACT AEPB Inhale into the lungs.    ? gabapentin (NEURONTIN) 100 MG capsule Take 100 mg by mouth 3 (three) times daily.    ? ?No current facility-administered medications for this visit.  ? ? ? ?Review of Systems ?Full ROS  was asked and was negative except for the information on the HPI ? ?Physical Exam ?Blood pressure 137/90, pulse 92, temperature 98 ?F (36.7 ?C), temperature source Oral, height 5\' 2"  (1.575 m), weight 179 lb 6.4 oz (81.4 kg), SpO2 97 %. ?CONSTITUTIONAL: NAD. ?EYES: Pupils are equal, round, and, Sclera are  non-icteric. ?EARS, NOSE, MOUTH AND THROAT: The oropharynx is clear. T she is wearing a mask. Hearing is intact to voice. ?LYMPH NODES:  Lymph nodes in the neck are normal. ?RESPIRATORY:  Lungs are clear. There is normal respiratory effort, with equal breath sounds bilaterally, and without pathologic use of accessory muscles. ?CARDIOVASCULAR: Heart is regular without murmurs, gallops, or rubs. ?GI: The abdomen is  soft, nontender, and nondistended. There are no palpable masses. There is no hepatosplenomegaly. There are normal bowel sounds in all quadrants. ?GU: Rectal deferred.   ?MUSCULOSKELETAL: Normal muscle strength and tone. No cyanosis or edema.   ?SKIN: Turgor is good and there are no pathologic skin lesions or ulcers. ?NEUROLOGIC: Motor and sensation is grossly normal. Cranial nerves are grossly intact. ?PSYCH:  Oriented to person, place and time. Affect is normal. ? ?Data Reviewed ? ?I have personally reviewed the patient's imaging, laboratory findings and medical records.   ? ?Assessment/Plan ?51 year old female with significant GERD symptoms that are interfering with her quality of life and are exacerbating pulmonary symptoms.  Discussed with the patient in detail about her disease process.  Specifically discussed the role for antireflux procedures.  She does have some dysphagia that according to prior work-up she does not have a primary esophageal motility disorder.  Even though she does not have an esophageal motility disorder I will be hesitant to do a complete Nissen fundoplication and probably rather offer her a Tupet fundoplication.  We will start the work-up with a CT scan of the abdomen and pelvis to evaluate the mediastinal and  abdominal anatomy for preoperative planning.  We will also repeat a swallow study since it has been several years and we will get a current EGD given that she has some dysphagia and I want to rule out any potential strictures. ?Also discussed with her about diet exercise  to optimize her BMI. ?A copy of this report was sent to the referring provider ?Please note that I spent greater than 55 minutes in this encounter including coordination of her care, counseling the patient, personally reviewing imaging studies, placing orders and performing appropriate documentation ? ?44, MD FACS ?General Surgeon ?06/16/2021, 12:24 PM ? ?  ?

## 2021-06-28 ENCOUNTER — Ambulatory Visit
Admission: RE | Admit: 2021-06-28 | Discharge: 2021-06-28 | Disposition: A | Discharge status: Home / Self Care | Payer: BC Managed Care – PPO | Source: Ambulatory Visit | Attending: Surgery | Admitting: Surgery

## 2021-06-28 ENCOUNTER — Encounter: Payer: Self-pay | Admitting: Radiology

## 2021-06-28 DIAGNOSIS — K449 Diaphragmatic hernia without obstruction or gangrene: Secondary | ICD-10-CM | POA: Diagnosis present

## 2021-06-28 MED ORDER — IOHEXOL 300 MG/ML  SOLN
100.0000 mL | Freq: Once | INTRAMUSCULAR | Status: AC | PRN
  Administered 2021-06-28: 100 mL via INTRAVENOUS

## 2021-07-05 ENCOUNTER — Ambulatory Visit
Admission: RE | Admit: 2021-07-05 | Discharge: 2021-07-05 | Disposition: A | Discharge status: Home / Self Care | Payer: BC Managed Care – PPO | Source: Ambulatory Visit | Attending: Surgery | Admitting: Surgery

## 2021-07-05 DIAGNOSIS — K449 Diaphragmatic hernia without obstruction or gangrene: Secondary | ICD-10-CM | POA: Diagnosis not present

## 2021-07-26 ENCOUNTER — Encounter: Admission: RE | Payer: Self-pay | Source: Home / Self Care

## 2021-07-26 ENCOUNTER — Ambulatory Visit
Admission: RE | Admit: 2021-07-26 | Payer: BC Managed Care – PPO | Source: Home / Self Care | Admitting: Gastroenterology

## 2021-07-26 SURGERY — ESOPHAGOGASTRODUODENOSCOPY (EGD) WITH PROPOFOL
Anesthesia: General

## 2021-08-06 ENCOUNTER — Ambulatory Visit: Payer: BC Managed Care – PPO | Admitting: Surgery

## 2021-09-17 ENCOUNTER — Ambulatory Visit: Payer: BC Managed Care – PPO | Admitting: Gastroenterology

## 2024-01-03 IMAGING — RF DG ESOPHAGUS
11 of 13 series · 14 of 24 positions shown · non-contrast
Comparison: DG esophagus October 04, 2015

CLINICAL DATA: Patient with a history of severe GERD with pulmonary
symptoms. She presents today to Radiology for an esophagram for
further workup.

EXAM:
ESOPHAGUS/BARIUM SWALLOW/TABLET STUDY
TECHNIQUE: Combined double and single contrast examination was performed using
effervescent crystals, high-density barium, and thin liquid barium.
This exam was performed by Rifeli Pyrlitsanu, NP, and was supervised
and interpreted by Bauer, Jerica.
FLUOROSCOPY:
Radiation Exposure Index (as provided by the fluoroscopic device):
31.5 mGy Kerma

[Series 1: cp_standard · 0.25mm/px · 2 of 48 frames shown (1 of 10)]
[frame 8/48]
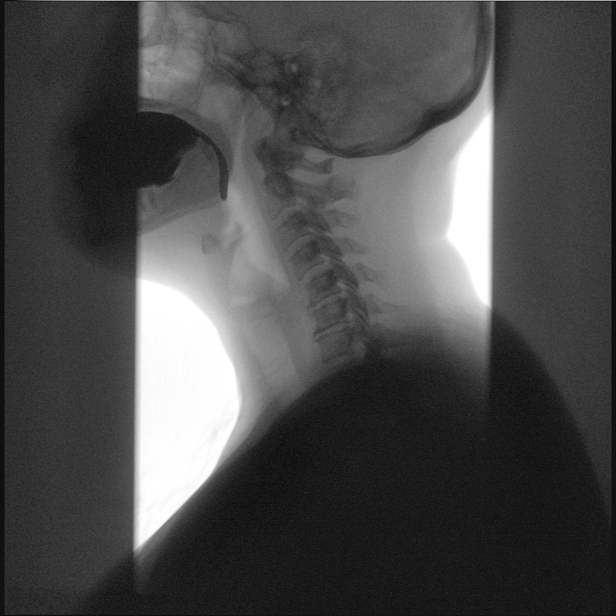
[frame 41/48]
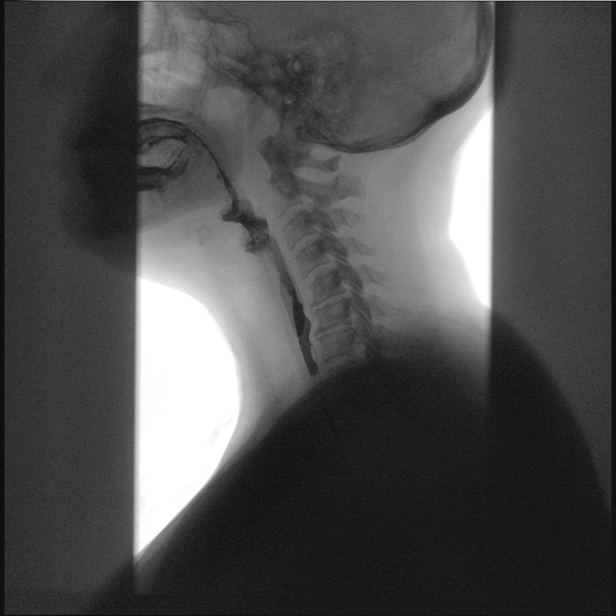

[Series 2: cp_standard · 0.25mm/px · 1 of 73 frames shown (2 of 10)]
[frame 37/73]
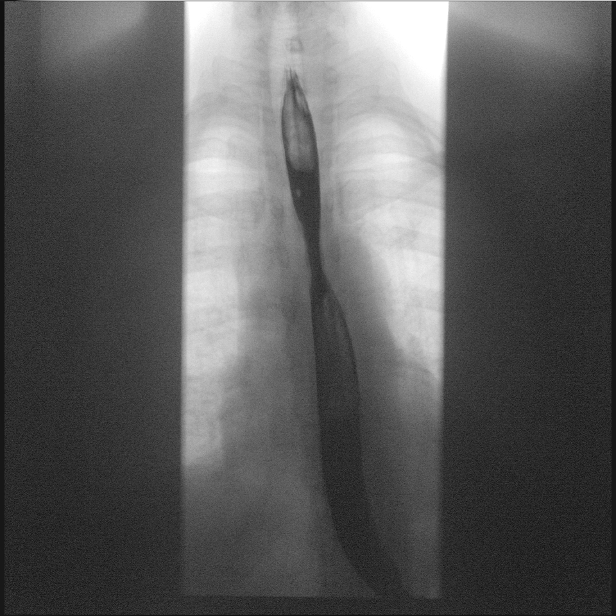

[Series 3: cp_standard · 0.25mm/px · 2 of 43 frames shown (3 of 10)]
[frame 7/43]
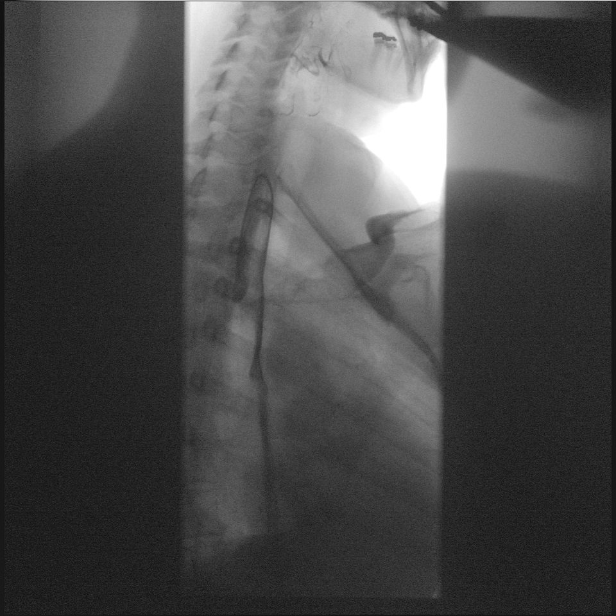
[frame 22/43]
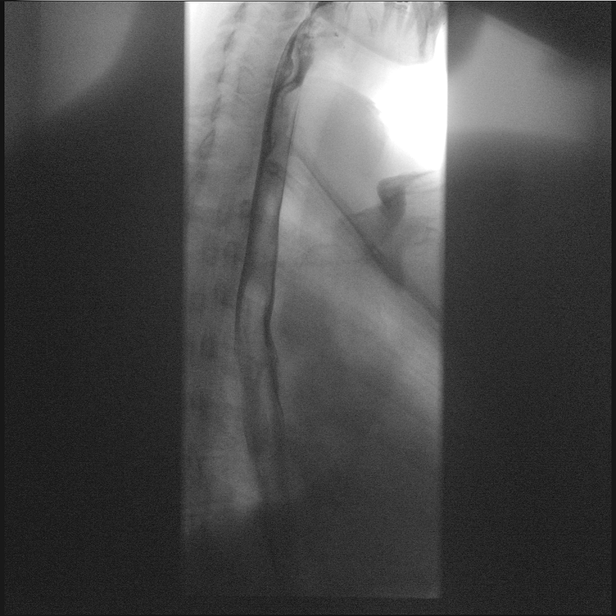

[Series 4: fluoro_barium 2fps_bw · 0.17mm/px · 1 of 3 frames shown]
[frame 2/3]
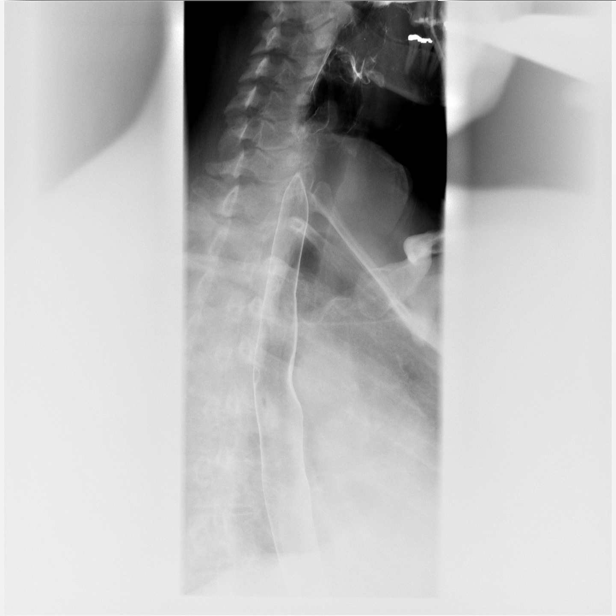

[Series 6: cp_standard · 0.17mm/px · 1 of 1 slices shown (4 of 10)]
[im 1/1]
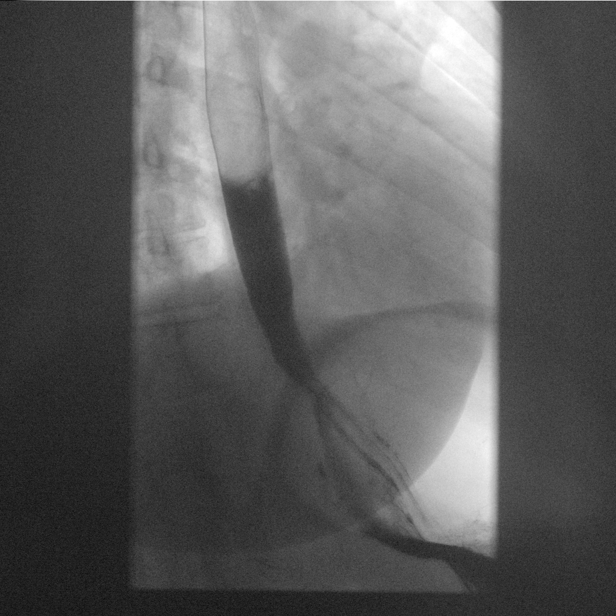

[Series 7: cp_standard · 0.17mm/px · 2 of 92 frames shown (5 of 10)]
[frame 14/92]
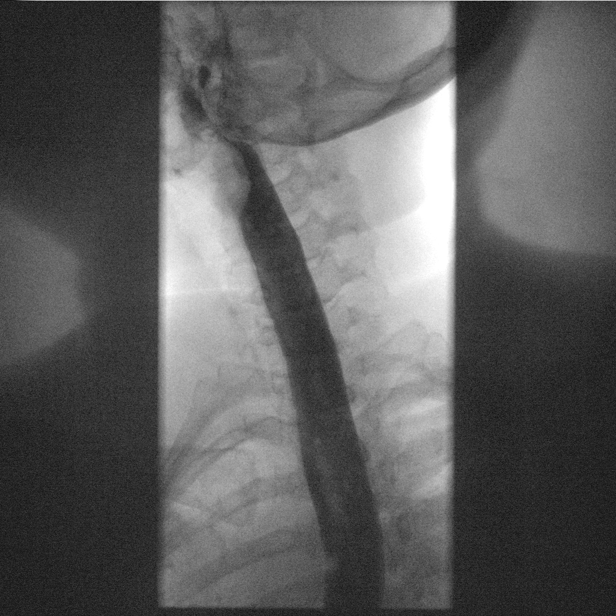
[frame 90/92]
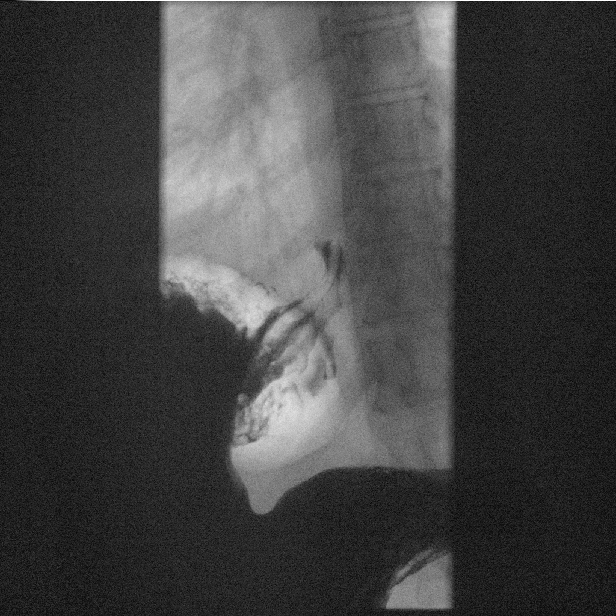

[Series 8: cp_standard · 0.26mm/px · 1 of 111 frames shown (6 of 10)]
[frame 56/111]
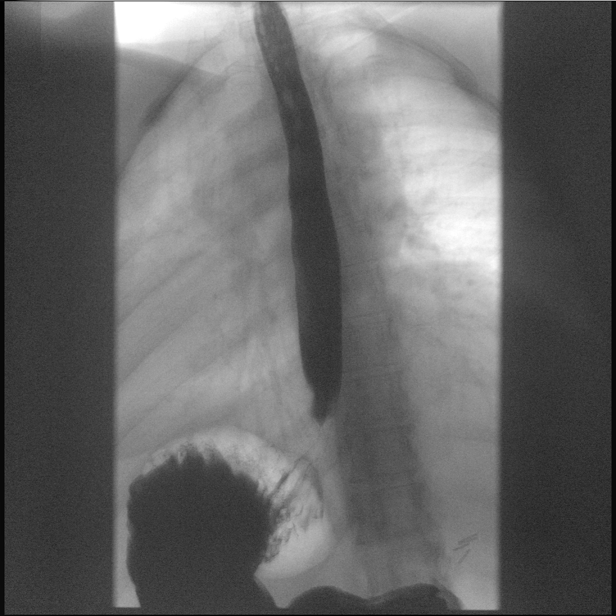

[Series 10: cp_standard · 0.17mm/px · 1 of 1 slices shown (7 of 10)]
[im 1/1]
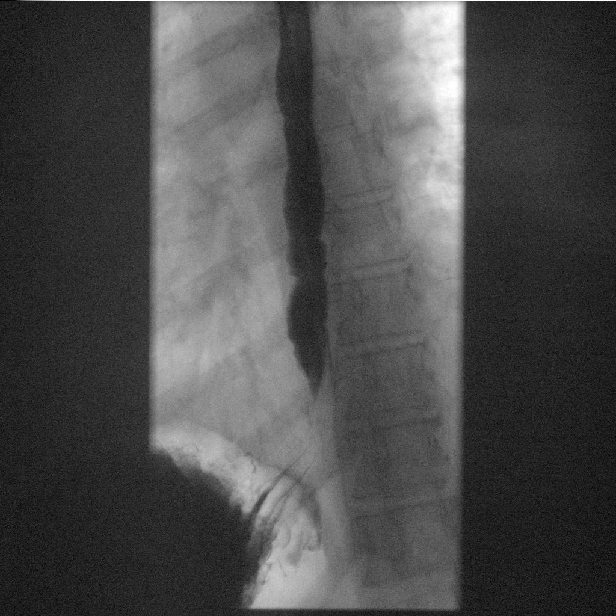

[Series 11: cp_standard · 0.18mm/px · 1 of 1 slices shown (8 of 10)]
[im 1/1]
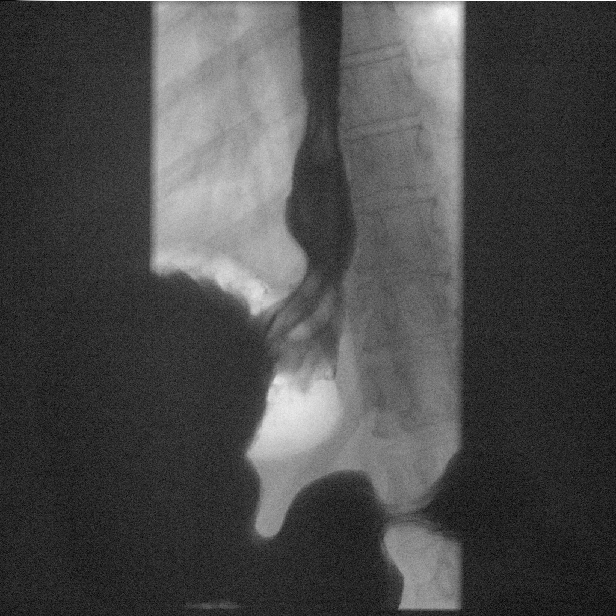

[Series 14: cp_standard · 0.26mm/px · 1 of 1 slices shown (9 of 10)]
[im 1/1]
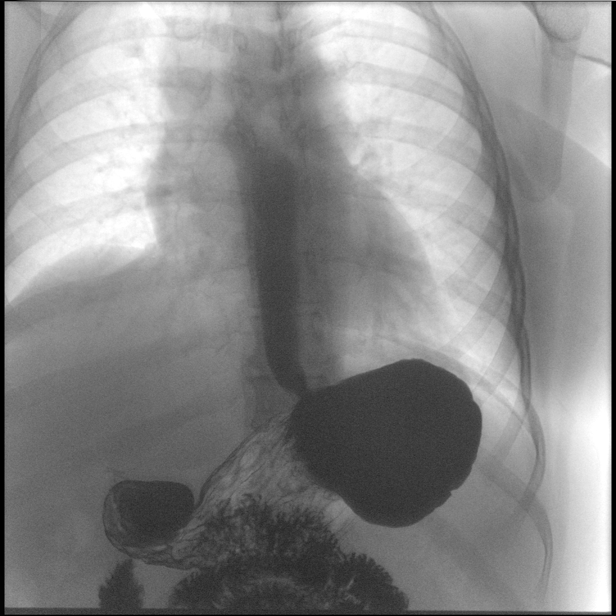

[Series 17: cp_standard · 0.26mm/px · 1 of 1 slices shown (10 of 10)]
[im 1/1]
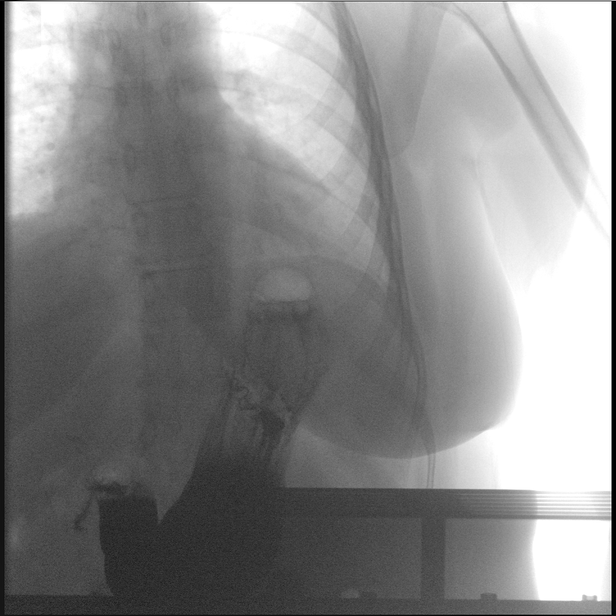

[14 of 24 positions shown; findings below may reference images not displayed]

FINDINGS: Cervical esophagram: No vestibular penetration or aspiration seen.
Anterior osteophytes at C5-C6 and C6-C7 which result in mild
indentation of the posterior cervical esophagus.

Thoracic esophagus: No visible ulceration or other abnormality.

Esophageal motility: Within normal limits.

Hiatal Hernia: None.

Gastroesophageal reflux: Moderate gastroesophageal reflux observed
to the level of the mid-esophagus, elicited via water siphon test.

Ingested 13mm barium tablet: Passed normally

Other: None.
IMPRESSION: Moderate gastroesophageal reflux observed to the level of the
midesophagus during the exam. No hiatal hernia.

Normal esophageal motility. No evidence of esophageal stricture. No
visible ulceration.

Read by: Rifeli Pyrlitsanu, NP
